# Patient Record
Sex: Female | Born: 2000 | Race: Black or African American | Hispanic: No | Smoking: Never smoker
Health system: Southern US, Community
[De-identification: ages and names within clinical notes are randomized; demographics above are authoritative.]

## PROBLEM LIST (undated history)

## (undated) DIAGNOSIS — T7840XA Allergy, unspecified, initial encounter: Secondary | ICD-10-CM

## (undated) DIAGNOSIS — J329 Chronic sinusitis, unspecified: Secondary | ICD-10-CM

## (undated) HISTORY — DX: Chronic sinusitis, unspecified: J32.9

## (undated) HISTORY — DX: Allergy, unspecified, initial encounter: T78.40XA

---
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
## 2022-10-16 ENCOUNTER — Ambulatory Visit
Admission: RE | Admit: 2022-10-16 | Discharge: 2022-10-16 | Disposition: A | Discharge status: Home / Self Care | Payer: 59 | Source: Ambulatory Visit | Attending: Physician Assistant | Admitting: Physician Assistant

## 2022-10-16 ENCOUNTER — Ambulatory Visit (INDEPENDENT_AMBULATORY_CARE_PROVIDER_SITE_OTHER): Payer: 59

## 2022-10-16 VITALS — BP 112/74 | HR 76 | Temp 98.9°F | Resp 16

## 2022-10-16 DIAGNOSIS — M79661 Pain in right lower leg: Secondary | ICD-10-CM | POA: Diagnosis not present

## 2022-10-16 DIAGNOSIS — R8281 Pyuria: Secondary | ICD-10-CM

## 2022-10-16 DIAGNOSIS — M25511 Pain in right shoulder: Secondary | ICD-10-CM

## 2022-10-16 DIAGNOSIS — M542 Cervicalgia: Secondary | ICD-10-CM | POA: Diagnosis not present

## 2022-10-16 DIAGNOSIS — M25512 Pain in left shoulder: Secondary | ICD-10-CM

## 2022-10-16 DIAGNOSIS — R0789 Other chest pain: Secondary | ICD-10-CM | POA: Diagnosis not present

## 2022-10-16 LAB — POCT URINALYSIS DIP (MANUAL ENTRY)
Bilirubin, UA: NEGATIVE
Glucose, UA: NEGATIVE mg/dL
Nitrite, UA: NEGATIVE
Protein Ur, POC: 30 mg/dL — AB
Spec Grav, UA: 1.025 (ref 1.010–1.025)
Urobilinogen, UA: 1 E.U./dL
pH, UA: 7 (ref 5.0–8.0)

## 2022-10-16 MED ORDER — TIZANIDINE HCL 4 MG PO TABS: 4.0000 mg | ORAL_TABLET | Freq: Four times a day (QID) | ORAL | 0 refills | Status: DC | PRN

## 2022-10-16 MED ORDER — NITROFURANTOIN MONOHYD MACRO 100 MG PO CAPS: 100.0000 mg | ORAL_CAPSULE | Freq: Two times a day (BID) | ORAL | 0 refills | Status: DC

## 2022-10-16 MED ORDER — IBUPROFEN 600 MG PO TABS: 600.0000 mg | ORAL_TABLET | Freq: Four times a day (QID) | ORAL | 0 refills | Status: AC | PRN

## 2022-10-16 NOTE — Discharge Instructions (Addendum)
Advised to use ice therapy, 10 minutes on 20 minutes off, 3-4 times throughout the day to help reduce pain and swelling. Advised take ibuprofen 600 mg every 6 hours with food on a regular basis to decrease pain. Advised to take the Zanaflex 4 mg every 6-8 hours to help relieve muscle spasm and irritability.  Advised to follow-up PCP return to urgent care if symptoms fail to improve.
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
## 2023-12-04 ENCOUNTER — Ambulatory Visit: Payer: Self-pay | Admitting: Podiatry

## 2024-04-29 NOTE — Progress Notes (Deleted)
*  HPI:**   Sherri Santiago is a 23 y.o.female presenting for new patient psychiatric evaluation.    - Chief concern: ***   - Onset/duration: ***   - Course: [gradual/sudden; improving/worsening/stable]   - Mood: [depressed/anxious/irritable/stable]   - Anxiety: [none/mild/moderate/severe; panic attacks Y/N]   - Sleep: [normal / insomnia / hypersomnia], ~ *** hrs/night   - Appetite/weight: [increased/decreased/stable]   - Energy: [low/normal/high]   - Concentration: [intact/poor/distractible]   - Psychosis: [denies / hallucinations / delusions]   - Mania: [denies / elevated mood / impulsivity]   - Safety: [denies SI/HI / passive SI without plan / active SI with plan]    **Psychiatric History:**   - Diagnoses: ***   - Medications (current/past): ***   - Therapy: ***   - Hospitalizations: ***   - Suicide attempts: ***    **Medical History:**   ***    **Substance Use:**   [alcohol, tobacco, cannabis, stimulants, opioids, other]    **Family/Social History:**   - Family psych history: ***   - Living situation/support: ***   - Employment/school: ***   - Stressors: ***   - Trauma/abuse: ***    **Mental Status Exam:**   Appearance: [appropriate/groomed/disheveled]   Behavior: [cooperative/guarded/agitated]   Speech: [normal/rapid/pressured/slow]   Mood: ***   Affect: [congruent/incongruent/flat/restricted]   Thought process: [linear/goal-directed/tangential/circumstantial]   Thought content: [normal/SI/HI/delusions]   Perceptions: [none/hallucinations]   Cognition: [alert/oriented/attention intact/impaired]   Insight: [good/fair/poor]   Judgment: [good/fair/poor]    **Assessment:**   - Working diagnosis: ***   - Risk assessment: [low/moderate/high risk]    **Plan:**   1. Medications: *** (risks/benefits/alternatives discussed)   2. Therapy: [CBT/psychodynamic/DBT/supportive; referral as appropriate]   3. Labs/monitoring: ***   4. Safety plan: ***   5. Follow-up: ***     **Patient engaged in shared decision-making; treatment plan reviewed and agreed upon.**

## 2024-05-07 ENCOUNTER — Ambulatory Visit (INDEPENDENT_AMBULATORY_CARE_PROVIDER_SITE_OTHER): Admitting: Emergency Medicine

## 2024-05-07 ENCOUNTER — Encounter: Payer: Self-pay | Admitting: Emergency Medicine

## 2024-05-07 VITALS — BP 133/90 | HR 96 | Ht 63.0 in | Wt 154.0 lb

## 2024-05-07 DIAGNOSIS — F4321 Adjustment disorder with depressed mood: Secondary | ICD-10-CM

## 2024-05-07 DIAGNOSIS — F4381 Prolonged grief disorder: Secondary | ICD-10-CM
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
## 2024-06-03 ENCOUNTER — Ambulatory Visit: Payer: Self-pay | Attending: Internal Medicine | Admitting: Internal Medicine

## 2024-06-03 VITALS — BP 119/71 | HR 98 | Ht 63.0 in | Wt 161.7 lb

## 2024-06-03 DIAGNOSIS — R55 Syncope and collapse: Secondary | ICD-10-CM | POA: Diagnosis not present

## 2024-06-03 DIAGNOSIS — R000 Tachycardia, unspecified: Secondary | ICD-10-CM | POA: Diagnosis not present

## 2024-06-03 NOTE — Patient Instructions (Addendum)
 Medication Instructions:  No Changes  Testing/Procedures: Your physician has requested that you have an echocardiogram. Echocardiography is a painless test that uses sound waves to create images of your heart. It provides your doctor with information about the size and shape of your heart and how well your heart's chambers and valves are working. This procedure takes approximately one hour. There are no restrictions for this procedure. Please do NOT wear cologne, perfume, aftershave, or lotions (deodorant is allowed). Please arrive 15 minutes prior to your appointment time.  Please note: We ask at that you not bring children with you during ultrasound (echo/ vascular) testing. Due to room size and safety concerns, children are not allowed in the ultrasound rooms during exams. Our front office staff cannot provide observation of children in our lobby area while testing is being conducted. An adult accompanying a patient to their appointment will only be allowed in the ultrasound room at the discretion of the ultrasound technician under special circumstances. We apologize for any inconvenience.   Follow-Up: At [facility], you and your health needs are our priority.  As part of our continuing mission to provide you with exceptional heart care, our providers are all part of one team.  This team includes your primary Cardiologist (physician) and Advanced Practice Providers or APPs (Physician Assistants and Nurse Practitioners) who all work together to provide you with the care you need, when you need it.  Your next appointment:    As Needed; please call the office for an appointment  Provider:   Gayatri A Acharya, MD   We recommend signing up for the patient portal called MyChart.  Sign up information is provided on this After Visit Summary.  MyChart is used to connect with patients for Virtual Visits (Telemedicine).  Patients are able to view lab/test results, encounter notes, upcoming  appointments, etc.  Non-urgent messages can be sent to your provider as well.   To learn more about what you can do with MyChart, go to [email or web address].   Other Instructions Please call us  or send a MyChart message with any Cardiology related questions/concerns.  [phone].  Thank you!

## 2024-06-03 NOTE — Progress Notes (Signed)
 Cardiology Office Note:  .   Date:  06/03/2024  ID:  Trenise Turay, DOB [birth year], MRN [id number] PCP: Stephane Leita DEL, MD  [facility] Providers Cardiologist:  Soyla DELENA Merck, MD    History of Present Illness: .   Sherri Santiago is a 23 y.o. female.  Discussed the use of AI scribe software for clinical note transcription with the patient, who gave verbal consent to proceed.  History of Present Illness Sherri Santiago is a 23 year old female who presents with recurrent fainting episodes.  She experienced a fainting spell in a grocery store two days ago, with similar episodes occurring frequently about a year and a half ago. These episodes often occur when she is hot or hungry and are associated with smoking marijuana. She did not eat on the day of the recent episode but drank two bottles of water. She first started experiencing these episodes at the age of 70 or 28.  She feels hot and dizzy before fainting, without sweating, and always receives a warning before passing out. During the recent episode, she was caught by her mother. Her chest feels tighter since the fainting episode, especially when breathing, and this sensation has persisted for two days.  Her family history includes her biological mother passing away from cancer and her father having cancer, with no known heart issues in the family. She started smoking marijuana at age 6 after her biological mother's passing. She does not smoke cigarettes or drink alcohol. No history of heart palpitations except when smoking marijuana.    ROS: negative except per HPI above.  Studies Reviewed: SABRA   EKG Interpretation Date/Time:  Thursday June 03 2024 15:48:33 EDT Ventricular Rate:  98 PR Interval:  156 QRS Duration:  102 QT Interval:  340 QTC Calculation: 434 R Axis:   57  Text Interpretation: Normal sinus rhythm Normal ECG Confirmed by Merck Soyla ([id number]) on 06/03/2024 4:19:57 PM     Results DIAGNOSTIC Electrocardiogram (ECG): Normal Risk Assessment/Calculations:       Physical Exam:   VS:  BP 119/71   Pulse 98   Ht 5' 3 (1.6 m)   Wt 161 lb 11.2 oz (73.3 kg)   SpO2 98%   BMI 28.64 kg/m    Wt Readings from Last 3 Encounters:  06/03/24 161 lb 11.2 oz (73.3 kg)  02/14/20 150 lb (68 kg) (86%, Z= 1.09)*  07/12/19 148 lb 8 oz (67.4 kg) (86%, Z= 1.10)*   * Growth percentiles are based on CDC (Girls, 2-20 Years) data.     Physical Exam GENERAL: Alert, cooperative, well developed, no acute distress. HEENT: Normocephalic, normal oropharynx, moist mucous membranes. CHEST: Clear to auscultation bilaterally, no wheezes, rhonchi, or crackles. CARDIOVASCULAR: Normal heart rate and rhythm, S1 and S2 normal without murmurs. ABDOMEN: Soft, non-tender, non-distended, without organomegaly, normal bowel sounds. EXTREMITIES: No cyanosis or edema. NEUROLOGICAL: Cranial nerves grossly intact, moves all extremities without gross motor or sensory deficit.   ASSESSMENT AND PLAN: .    Assessment and Plan Assessment & Plan Vasovagal syncope Recurrent syncope likely vasovagal, triggered by heat, hunger, and marijuana. Normal EKG. Discussed benign nature but emphasized fall injury risk. - Order echocardiogram to evaluate cardiac structure and function. - Advise wearing medium-strength compression socks. - Encourage adequate hydration with electrolytes. - Advise regular meals to maintain blood sugar levels. - Recommend cessation of marijuana use.  Chest tightness following syncope Persistent chest tightness post-syncope, likely musculoskeletal if relieved by NSAIDs. Differential includes musculoskeletal versus  cardiac causes. - Order echocardiogram to assess for cardiac causes. - Recommend taking ibuprofen  with food or tylenol to assess relief.        Soyla Merck, MD, FACC

## 2024-06-07 ENCOUNTER — Ambulatory Visit: Admitting: Emergency Medicine

## 2024-06-08 ENCOUNTER — Ambulatory Visit: Admitting: Emergency Medicine

## 2024-06-08 ENCOUNTER — Encounter: Payer: Self-pay | Admitting: Emergency Medicine

## 2024-06-08 DIAGNOSIS — F4321 Adjustment disorder with depressed mood: Secondary | ICD-10-CM

## 2024-06-08 NOTE — Progress Notes (Signed)
 Sherri Santiago [id number] 02-01-2001 23 y.o.  Subjective:   Patient ID:  Sherri Santiago is a 23 y.o. (DOB April 23, 2001) female.  Chief Complaint:  Chief Complaint  Patient presents with   Follow-up   HPI Marquelle Balow presents to the office today for follow-up for situational depression.    Pt is doing moderately better since LOV. Celebrated her 23st birthday on [birth year] with family and boyfriend in [place]. Her relationship with boyfriend of 2 years is doing better. Communication and conflict resolution has improved. Denies emotional or physical abuse. Has lunch date planned with best friend, Rolin on 11/8. She reports  she is doing better with her grief related to her mother's death 7 years ago.   She is able to enjoy activities including painting and drawing. She notes mild decrease in energy and lack of motivation, reports she is submitting school work late. However, she has registered for class for new semester and plans to graduate early. Denies significant depressive symptoms such as extreme sadness, tearfulness, or hopelessness.  She reports that anxiety symptoms remain well-controlled overall, with decreased worry and restlessness since the previous visit.  However, she notes anxiety tends to worsen temporarily with marijuana use.   She has reduced marijuana use to morning and evening only, compared to more frequent use reported at LOV, and she expresses motivation to quit gradually. She identifies that she uses marijuana due to boredom and a lack of daily structure, which she currently is not working and only has class in the morning. 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 Sleep is ok and appetite is poor due to not feeling hungry, with normal weight and intact ADLs and personal hygiene. Cognitive  functions like concentration, decision-making, and memory are intact.   No current psych medication and does not prefer to start medication at this time.  Denies mania, delirium, AVH, SI, HI or self-harm behaviors. No further complaints at this time.   PHQ2-9    Flowsheet Row Office Visit from 06/08/2024 in [facility] Office Visit from 05/07/2024 in [facility] Office Visit from 05/03/2019 in [place], [facility]  PHQ-2 Total Score 1 3 2   PHQ-9 Total Score 5 11 6    Flowsheet Row UC from 10/16/2022 in [facility])  C-SSRS RISK CATEGORY No Risk     Review of Systems:  Review of Systems  Psychiatric/Behavioral:         Please refer to HPI.  All other systems reviewed and are negative.   Past medications for mental health diagnoses include: None  Medications: I have reviewed the patient's current medications.  Current Outpatient Medications  Medication Sig Dispense Refill   albuterol  (VENTOLIN  HFA) 108 (90 Base) MCG/ACT inhaler Inhale 2 puffs into the lungs every 4 (four) hours as needed. 1 each 6   cetirizine  (ZYRTEC ) 10 MG tablet 1 tab p.o. nightly as needed allergies. 30 tablet 2   fluticasone  (FLONASE ) 50 MCG/ACT nasal spray 1 spray each nostril once a day as needed congestion. 16 g 2   ibuprofen  (ADVIL ) 600 MG tablet Take 1 tablet (600 mg total) by mouth every 6 (six) hours as needed. 30 tablet 0   amoxicillin  (AMOXIL ) 500 MG capsule 1 tab by mouth twice a day for 6 days. (  Patient not taking: Reported on 06/08/2024) 12 capsule 0   beclomethasone (QVAR ) 40 MCG/ACT inhaler Inhale 2 puffs into the lungs 2 (two) times daily. 8.7 g 0   ciprofloxacin -dexamethasone  (CIPRODEX ) OTIC suspension 4 drops to affected ear twice a day for 5 days. (Patient not taking: Reported on 06/03/2024) 7.5 mL 0   nitrofurantoin , macrocrystal-monohydrate, (MACROBID ) 100 MG capsule Take 1 capsule (100 mg  total) by mouth 2 (two) times daily. (Patient not taking: Reported on 06/03/2024) 10 capsule 0   tiZANidine  (ZANAFLEX ) 4 MG tablet Take 1 tablet (4 mg total) by mouth every 6 (six) hours as needed for muscle spasms. (Patient not taking: Reported on 06/03/2024) 30 tablet 0   No current facility-administered medications for this visit.    Medication Side Effects: None  Allergies: No Known Allergies  Past Medical History:  Diagnosis Date   Allergy    Asthma    Sinusitis     Past Medical History, Surgical history, Social history, and Family history were reviewed and updated as appropriate.   Please see review of systems for further details on the patient's review from today.   Objective:   Physical Exam:  There were no vitals taken for this visit.  Physical Exam Constitutional:      Appearance: Normal appearance. She is normal weight.  Neurological:     Mental Status: She is alert.  Psychiatric:        Attention and Perception: Attention and perception normal.        Mood and Affect: Mood and affect normal.        Speech: Speech normal.        Behavior: Behavior normal.        Thought Content: Thought content normal.        Cognition and Memory: Cognition and memory normal.        Judgment: Judgment normal.     Comments: Insight intact     Lab Review:     Component Value Date/Time   NA 140 07/09/2019 1409   K 4.0 07/09/2019 1409   CL 106 07/09/2019 1409   CO2 23 07/09/2019 1409   GLUCOSE 83 07/09/2019 1409   BUN 9 07/09/2019 1409   CREATININE 0.67 07/09/2019 1409   CALCIUM 9.2 07/09/2019 1409   PROT 6.6 07/09/2019 1409   AST 13 07/09/2019 1409   ALT 6 07/09/2019 1409   BILITOT 0.9 07/09/2019 1409   BILITOT 0.9 07/09/2019 1409       Component Value Date/Time   WBC 3.9 (L) 07/09/2019 1409   RBC 4.83 07/09/2019 1409   HGB 13.2 07/09/2019 1409   HCT 39.7 07/09/2019 1409   PLT 394 07/09/2019 1409   MCV 82.2 07/09/2019 1409   MCH 27.3 07/09/2019 1409   MCHC  33.2 07/09/2019 1409   RDW 12.5 07/09/2019 1409   LYMPHSABS 1,747 07/09/2019 1409   EOSABS 70 07/09/2019 1409   BASOSABS 31 07/09/2019 1409    No results found for: POCLITH, LITHIUM   No results found for: PHENYTOIN, PHENOBARB, VALPROATE, CBMZ   .res Assessment: Plan:    Geniya was seen today for follow-up.  Diagnoses and all orders for this visit:  Situational depression    Please see After Visit Summary for patient specific instructions.  Future Appointments  Date Time Provider Department Center  07/05/2024 11:15 AM [facility]-ECHO 6 [facility]-ECHO H&V  07/06/2024  1:00 PM Evann Erazo, PA-C CP-CP None    No orders of the defined types were placed in this encounter.  I provided approximately 20 minutes of face to face time during this encounter, including time spent before and after the visit in records review, medical decision making, counseling pertinent to today's visit, and charting.   Discussed dx and tx plan. Discussed alternative options including therapy.   PDMP reviewed: low risk trend   Situational Depression - controlled No initiation of medication at this time upon pt request.  Will revisit mediation discussion at FU If pt decides to move forward with medication - will start Lexapro 10 mg daily or Wellbutrin 150 XL Discussed impact of daily marijuana use on mood and anxiety; provided education on potential effects and safety. Also, provided information for marijuana  cessation. Psychotherapy was strongly recommended as important adjunct treatment to medication - provided list of local counselors. Recommended Marko Slocumb.  FU - 4 weeks or if clinicallyindicated  Risks, benefits, and alternatives of the medications and treatment plan prescribed today were discussed. Patient engaged in shared decision-making;treatment plan reviewed and agreed upon.    Arnetta Odeh PA-C

## 2024-07-05 ENCOUNTER — Encounter (HOSPITAL_COMMUNITY): Payer: Self-pay | Admitting: Internal Medicine

## 2024-07-05 ENCOUNTER — Ambulatory Visit (HOSPITAL_COMMUNITY): Attending: Cardiovascular Disease

## 2024-07-06 ENCOUNTER — Ambulatory Visit: Payer: Self-pay | Admitting: Emergency Medicine

## 2024-07-06 DIAGNOSIS — Z91199 Patient's noncompliance with other medical treatment and regimen due to unspecified reason: Secondary | ICD-10-CM

## 2024-07-06 NOTE — Progress Notes (Signed)
 NO SHOW  Patient did not present for scheduled appointment today.   No clinical evaluation was performed.  Staff notified to contact patient to reschedule at earliest convenience.

## 2024-07-12 ENCOUNTER — Ambulatory Visit: Admitting: Emergency Medicine

## 2024-07-12 ENCOUNTER — Encounter: Payer: Self-pay | Admitting: Emergency Medicine

## 2024-07-12 DIAGNOSIS — F4321 Adjustment disorder with depressed mood: Secondary | ICD-10-CM | POA: Diagnosis not present
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
## 2024-08-04 NOTE — Progress Notes (Signed)
 NC1

## 2024-08-09 ENCOUNTER — Ambulatory Visit (INDEPENDENT_AMBULATORY_CARE_PROVIDER_SITE_OTHER): Admitting: Emergency Medicine

## 2024-08-09 DIAGNOSIS — F4321 Adjustment disorder with depressed mood: Secondary | ICD-10-CM | POA: Diagnosis not present
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
## 2024-09-06 ENCOUNTER — Ambulatory Visit: Admitting: Emergency Medicine

## 2024-09-09 ENCOUNTER — Ambulatory Visit: Admitting: Emergency Medicine

## 2024-09-16 ENCOUNTER — Ambulatory Visit: Admitting: Emergency Medicine
